# Patient Record
Sex: Male | Born: 1985 | Hispanic: No | Marital: Married | Smoking: Current every day smoker
Health system: Southern US, Community
[De-identification: ages and names within clinical notes are randomized; demographics above are authoritative.]

## PROBLEM LIST (undated history)

## (undated) DIAGNOSIS — K760 Fatty (change of) liver, not elsewhere classified: Secondary | ICD-10-CM

---

## 2016-10-22 ENCOUNTER — Ambulatory Visit
Admission: RE | Admit: 2016-10-22 | Discharge: 2016-10-22 | Disposition: A | Discharge status: Home / Self Care | Payer: Self-pay | Source: Ambulatory Visit | Attending: Internal Medicine | Admitting: Internal Medicine

## 2016-10-22 ENCOUNTER — Other Ambulatory Visit: Payer: Self-pay | Admitting: Internal Medicine

## 2016-10-22 DIAGNOSIS — M25362 Other instability, left knee: Secondary | ICD-10-CM

## 2016-10-22 DIAGNOSIS — R52 Pain, unspecified: Secondary | ICD-10-CM

## 2016-10-22 DIAGNOSIS — M25361 Other instability, right knee: Secondary | ICD-10-CM

## 2016-10-22 LAB — LIPID PANEL
Cholesterol: 239 — AB (ref 0–200)
HDL: 30 — AB (ref 35–70)
LDL Cholesterol: 182

## 2017-08-16 DIAGNOSIS — H5710 Ocular pain, unspecified eye: Secondary | ICD-10-CM | POA: Insufficient documentation

## 2017-09-26 IMAGING — CR DG KNEE COMPLETE 4+V*L*
4 series · 4 of 4 positions shown · non-contrast
Comparison: No recent prior.

CLINICAL DATA: Instability.  No injury.

EXAM:
LEFT KNEE - COMPLETE 4+ VIEW

[w knee ap left]
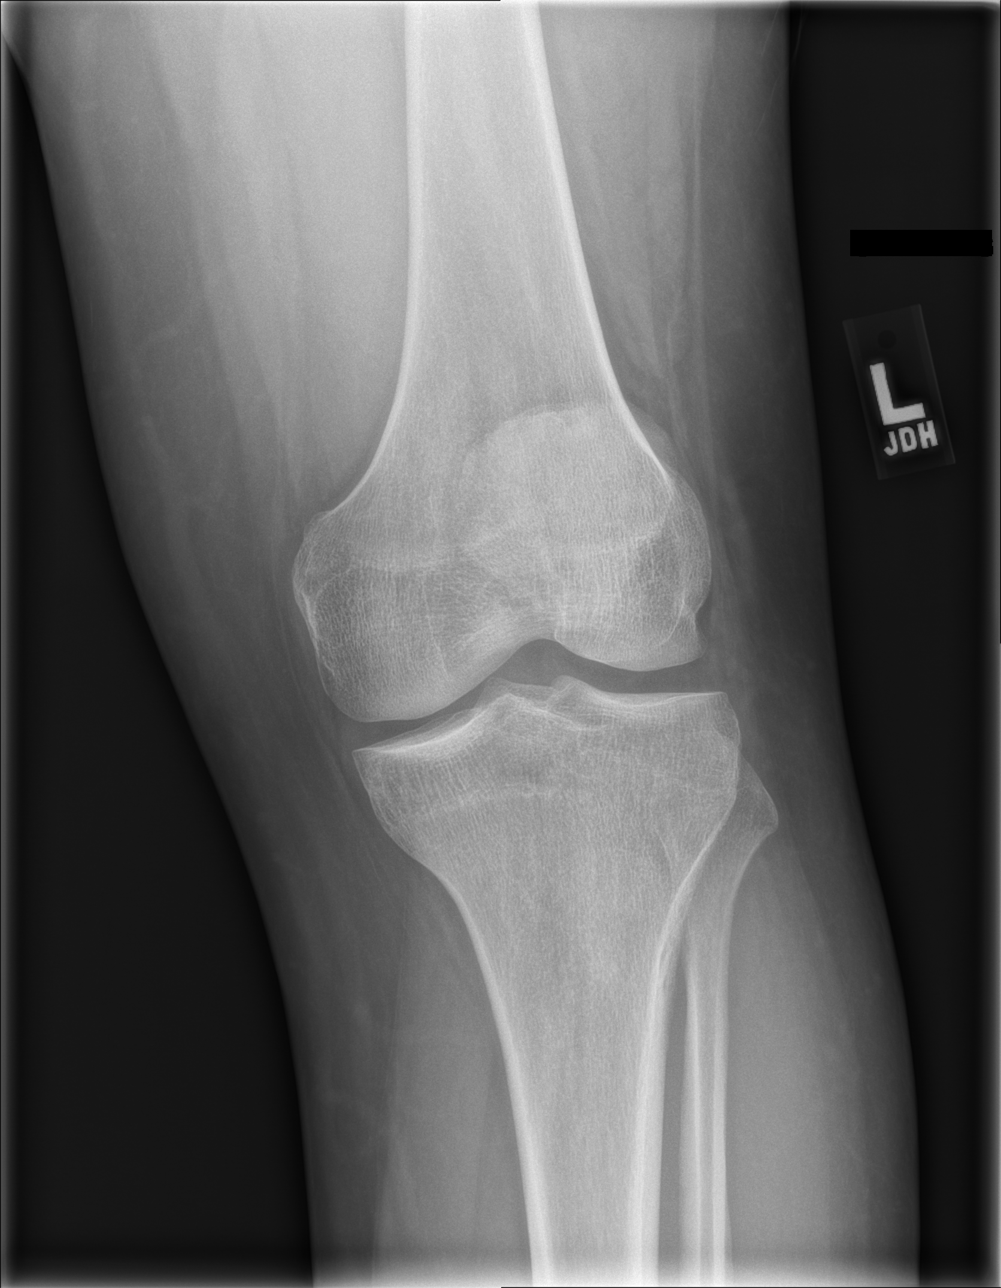

[w knee lat left]
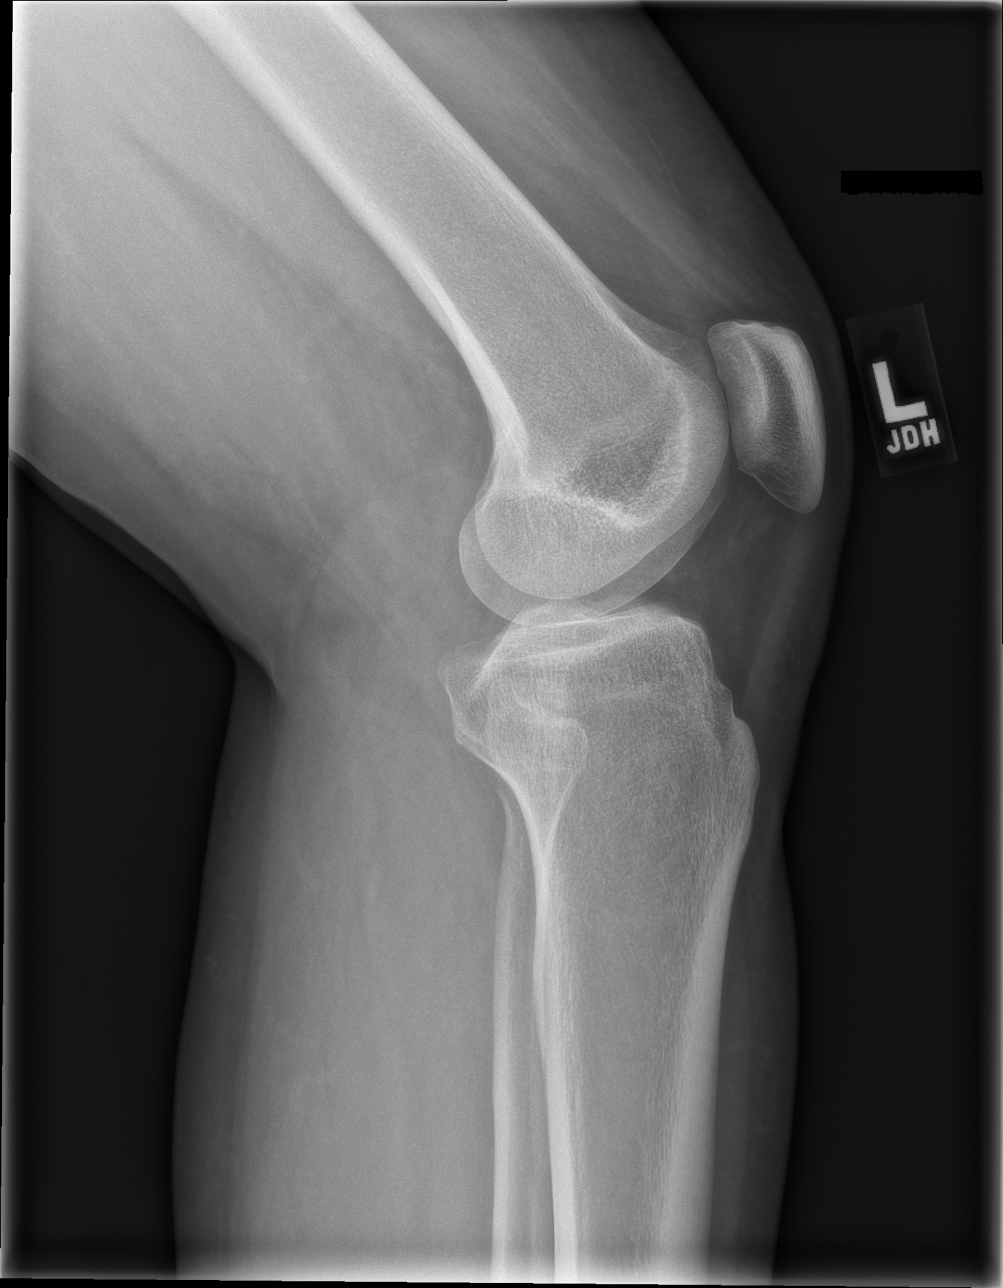

[x knee tunnel left]
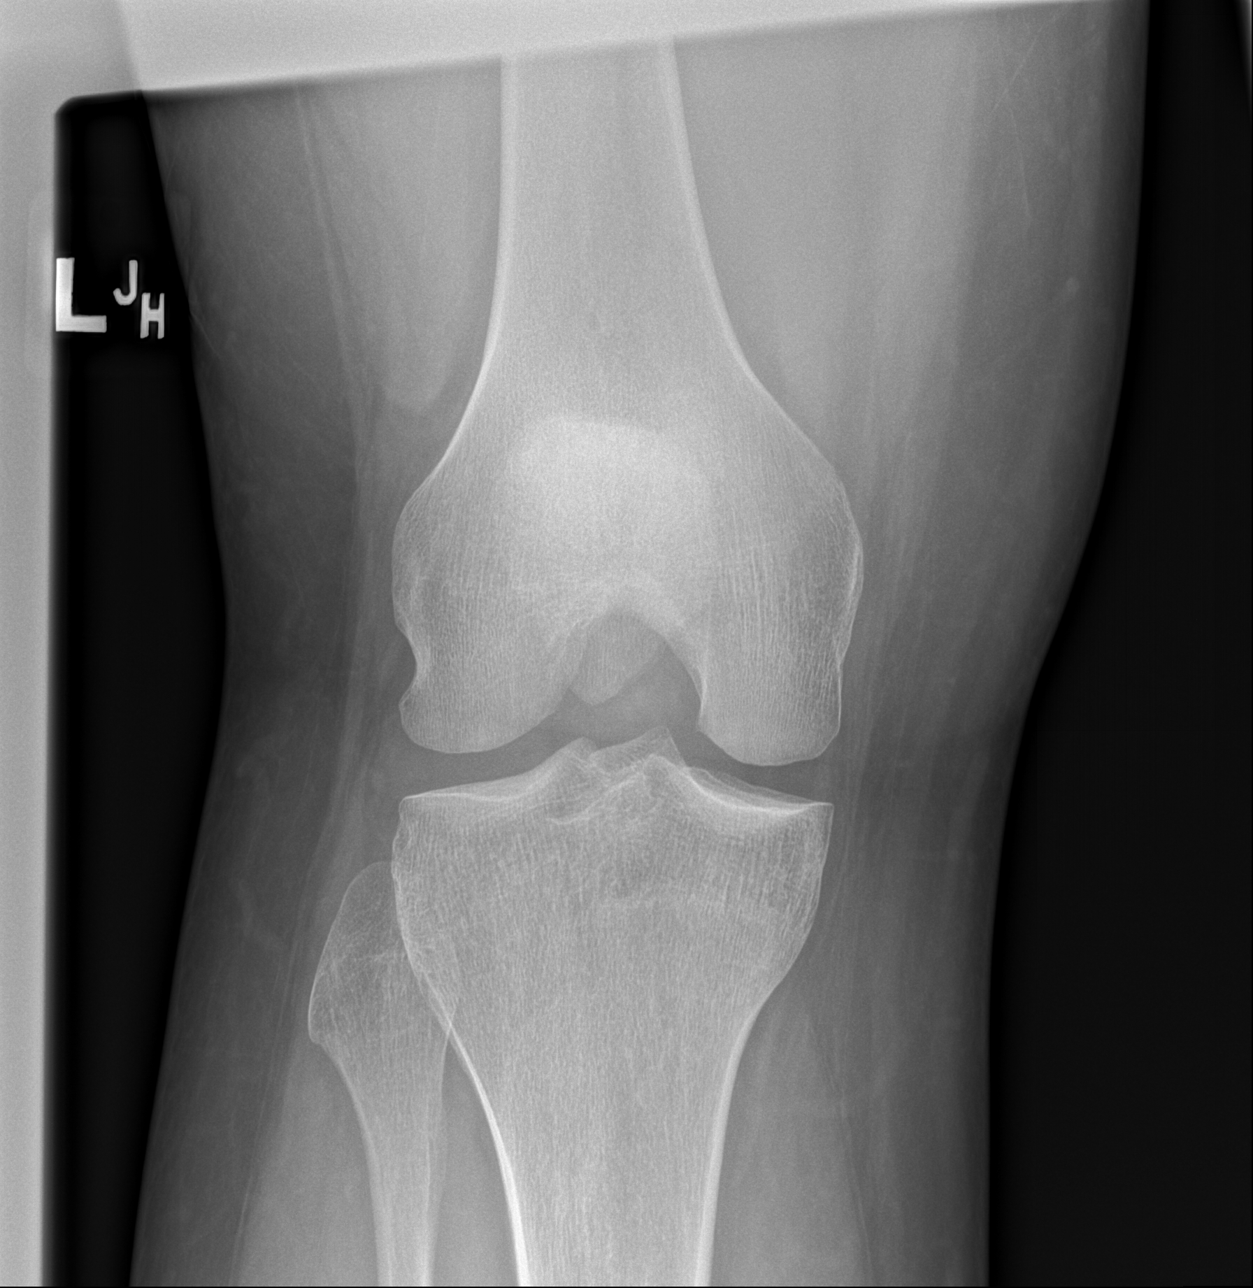

[x knee sunrise left]
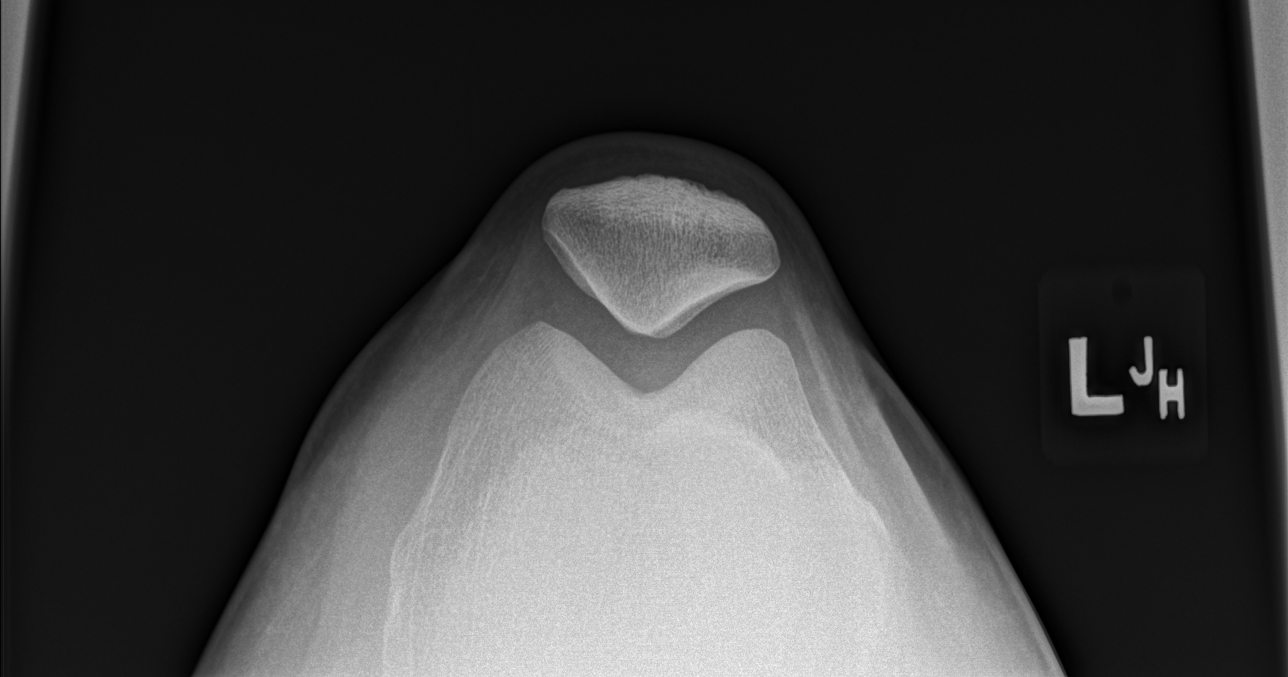

[4 of 4 positions shown; findings below may reference images not displayed]

FINDINGS: No acute bony or joint abnormality identified. No evidence of
fracture or dislocation.
IMPRESSION: No acute or focal abnormality.

## 2018-07-20 DIAGNOSIS — H5712 Ocular pain, left eye: Secondary | ICD-10-CM

## 2018-08-14 ENCOUNTER — Encounter: Payer: Self-pay | Admitting: Emergency Medicine

## 2018-08-14 ENCOUNTER — Other Ambulatory Visit: Payer: Self-pay

## 2018-08-14 ENCOUNTER — Emergency Department
Admission: EM | Admit: 2018-08-14 | Discharge: 2018-08-14 | Disposition: A | Discharge status: Home / Self Care | Payer: Self-pay | Attending: Emergency Medicine | Admitting: Emergency Medicine

## 2018-08-14 ENCOUNTER — Emergency Department: Payer: Self-pay

## 2018-08-14 DIAGNOSIS — R197 Diarrhea, unspecified: Secondary | ICD-10-CM | POA: Insufficient documentation

## 2018-08-14 DIAGNOSIS — Z79899 Other long term (current) drug therapy: Secondary | ICD-10-CM | POA: Insufficient documentation

## 2018-08-14 DIAGNOSIS — R1011 Right upper quadrant pain: Secondary | ICD-10-CM | POA: Insufficient documentation

## 2018-08-14 DIAGNOSIS — F17210 Nicotine dependence, cigarettes, uncomplicated: Secondary | ICD-10-CM | POA: Insufficient documentation

## 2018-08-14 DIAGNOSIS — R1013 Epigastric pain: Secondary | ICD-10-CM

## 2018-08-14 LAB — URINALYSIS, COMPLETE (UACMP) WITH MICROSCOPIC
BACTERIA UA: NONE SEEN
Bilirubin Urine: NEGATIVE
Glucose, UA: NEGATIVE mg/dL
Hgb urine dipstick: NEGATIVE
KETONES UR: NEGATIVE mg/dL
Leukocytes, UA: NEGATIVE
Nitrite: NEGATIVE
PROTEIN: NEGATIVE mg/dL
Specific Gravity, Urine: 1.013 (ref 1.005–1.030)
pH: 7 (ref 5.0–8.0)

## 2018-08-14 LAB — COMPREHENSIVE METABOLIC PANEL
ALBUMIN: 4.1 g/dL (ref 3.5–5.0)
ALK PHOS: 44 U/L (ref 38–126)
ALT: 24 U/L (ref 0–44)
ANION GAP: 7 (ref 5–15)
AST: 17 U/L (ref 15–41)
BUN: 6 mg/dL (ref 6–20)
CALCIUM: 9.1 mg/dL (ref 8.9–10.3)
CHLORIDE: 106 mmol/L (ref 98–111)
CO2: 27 mmol/L (ref 22–32)
Creatinine, Ser: 0.83 mg/dL (ref 0.61–1.24)
GFR calc Af Amer: >60 mL/min (ref >60)
GFR calc non Af Amer: >60 mL/min (ref >60)
GLUCOSE: 93 mg/dL (ref 70–99)
Potassium: 4.4 mmol/L (ref 3.5–5.1)
Sodium: 140 mmol/L (ref 135–145)
Total Bilirubin: 0.7 mg/dL (ref 0.3–1.2)
Total Protein: 7.3 g/dL (ref 6.5–8.1)

## 2018-08-14 LAB — CBC
HEMATOCRIT: 45.5 % (ref 40.0–52.0)
HEMOGLOBIN: 15.6 g/dL (ref 13.0–18.0)
MCH: 29.2 pg (ref 26.0–34.0)
MCHC: 34.3 g/dL (ref 32.0–36.0)
MCV: 85.3 fL (ref 80.0–100.0)
Platelets: 363 10*3/uL (ref 150–440)
RBC: 5.33 MIL/uL (ref 4.40–5.90)
RDW: 14.3 % (ref 11.5–14.5)
WBC: 9 10*3/uL (ref 3.8–10.6)

## 2018-08-14 LAB — LIPASE, BLOOD: LIPASE: 22 U/L (ref 11–51)

## 2018-08-14 MED ORDER — DICYCLOMINE HCL 10 MG PO CAPS
20.0000 mg | ORAL_CAPSULE | Freq: Once | ORAL | Status: AC
  Administered 2018-08-14: 20 mg via ORAL
  Filled 2018-08-14: qty 2

## 2018-08-14 MED ORDER — DICYCLOMINE HCL 20 MG PO TABS: 20.0000 mg | ORAL_TABLET | Freq: Three times a day (TID) | ORAL | 0 refills | Status: AC | PRN

## 2018-08-14 NOTE — ED Triage Notes (Signed)
Says mid abd pain.

## 2018-08-14 NOTE — ED Triage Notes (Signed)
C/O abdominal pain and nausea.  Also reports diarrhea yesterday.

## 2018-08-14 NOTE — ED Notes (Signed)
Patient transported to Ultrasound 

## 2018-08-14 NOTE — ED Provider Notes (Signed)
[facility] Emergency Department Provider Note  Time seen: 9:04 PM  I have reviewed the triage vital signs and the nursing notes.   HISTORY  Chief Complaint Abdominal Pain    HPI Marc Rivers is a 32 y.o. male with no significant past medical history presents to the emergency department with abdominal pain and diarrhea.  According to the patient for the past 2 days he has been experiencing some mid abdominal discomfort she describes as moderate type cramping along with diarrhea.  Denies any nausea vomiting or fever.  Denies any worsening with food.  Patient states he took Pepto-Bismol and has not had any diarrhea today but continues to have mid abdominal cramping so he came to the emergency department for evaluation.   History reviewed. No pertinent past medical history.  There are no active problems to display for this patient.   History reviewed. No pertinent surgical history.  Prior to Admission medications   Medication Sig Start Date End Date Taking? Authorizing Provider  atorvastatin (LIPITOR) 10 MG tablet Take 10 mg by mouth daily.    [provider]    No Known Allergies  No family history on file.  Social History Social History   Tobacco Use  . Smoking status: Current Every Day Smoker    Types: Cigarettes  . Smokeless tobacco: Never Used  Substance Use Topics  . Alcohol use: Never    Frequency: Never  . Drug use: Not on file    Review of Systems Constitutional: Negative for fever. Cardiovascular: Negative for chest pain. Respiratory: Negative for shortness of breath. Gastrointestinal: Mid abdominal cramping.  Negative for nausea vomiting.  Positive for diarrhea Genitourinary: Negative for urinary compaints Musculoskeletal: Negative for musculoskeletal complaints Skin: Negative for skin complaints  Neurological: Negative for headache All other ROS negative  ____________________________________________   PHYSICAL  EXAM:  VITAL SIGNS: ED Triage Vitals  Enc Vitals Group     BP 08/14/18 1510 (!) 128/92     Pulse Rate 08/14/18 1510 65     Resp 08/14/18 1510 16     Temp 08/14/18 1510 98.5 F (36.9 C)     Temp Source 08/14/18 1510 Oral     SpO2 08/14/18 1510 97 %     Weight 08/14/18 1508 220 lb (99.8 kg)     Height 08/14/18 1508 5\' 10"  (1.778 m)     Head Circumference --      Peak Flow --      Pain Score 08/14/18 2022 8     Pain Loc --      Pain Edu? --      Excl. in GC? --     Constitutional: Alert and oriented. Well appearing and in no distress. Eyes: Normal exam ENT   Head: Normocephalic and atraumatic.   Mouth/Throat: Mucous membranes are moist. Cardiovascular: Normal rate, regular rhythm. No murmur Respiratory: Normal respiratory effort without tachypnea nor retractions. Breath sounds are clear Gastrointestinal: Soft, moderate upper quadrant tenderness to palpation.  No rebound or guarding or distention. Musculoskeletal: Nontender with normal range of motion in all extremities.  Neurologic:  Normal speech and language. No gross focal neurologic deficits Skin:  Skin is warm, dry and intact.  Psychiatric: Mood and affect are normal.   ____________________________________________     RADIOLOGY  Ultrasound shows fatty liver, normal gall bladder.  ____________________________________________   INITIAL IMPRESSION / ASSESSMENT AND PLAN / ED COURSE  Pertinent labs & imaging results that were available during my care of the patient were  reviewed by me and considered in my medical decision making (see chart for details).  Patient presents to the emergency department for abdominal pain and diarrhea starting 2 days ago.  Diarrhea has since resolved after using Pepto-Bismol continues to have moderate cramping mid abdominal discomfort.  Denies any fever.  Patient does have mild right upper quadrant tenderness to palpation on examination.  Labs are reassuring however given his right  upper quadrant tenderness we will proceed with a right upper quadrant ultrasound to further evaluate.  Patient agreeable to this plan of care.  Patient's labs have resulted largely within normal limits including normal LFTs and lipase.  Patient's ultrasound shows fatty liver otherwise negative.  We will place the patient on Bentyl and discharged with PCP follow-up.  I discussed return precautions with the Arabic interpreter.  ____________________________________________   FINAL CLINICAL IMPRESSION(S) / ED DIAGNOSES  Right upper quadrant pain Diarrhea    Minna AntisPaduchowski, Siobhan Zaro, MD 08/14/18 2109

## 2020-11-22 ENCOUNTER — Other Ambulatory Visit: Payer: Self-pay

## 2020-11-22 ENCOUNTER — Encounter: Payer: Self-pay | Admitting: Emergency Medicine

## 2020-11-22 ENCOUNTER — Ambulatory Visit
Admission: EM | Admit: 2020-11-22 | Discharge: 2020-11-22 | Disposition: A | Discharge status: Home / Self Care | Payer: BLUE CROSS/BLUE SHIELD | Attending: Emergency Medicine | Admitting: Emergency Medicine

## 2020-11-22 DIAGNOSIS — H6692 Otitis media, unspecified, left ear: Secondary | ICD-10-CM

## 2020-11-22 HISTORY — DX: Fatty (change of) liver, not elsewhere classified: K76.0

## 2020-11-22 MED ORDER — AMOXICILLIN 875 MG PO TABS: 875.0000 mg | ORAL_TABLET | Freq: Two times a day (BID) | ORAL | 0 refills | Status: AC

## 2020-11-22 NOTE — Discharge Instructions (Signed)
Take the amoxicillin as directed.  Follow up with your primary care provider if your symptoms are not improving.   ° ° °

## 2020-11-22 NOTE — ED Provider Notes (Signed)
Marc Rivers    CSN: [id number] Arrival date & time: 11/22/20  1513      History   Chief Complaint Chief Complaint  Patient presents with  . Otalgia  . Headache    HPI Marc Rivers is a 34 y.o. male.   Patient presents with 1 day history of headache and left ear pain.  No treatments attempted at home.  He denies fever, chills, rash, sore throat, cough, shortness of breath, vomiting, diarrhea, or other symptoms.  He states he had cold symptoms 2 weeks ago but these have resolved.  The history is provided by the patient. A language interpreter was used.    Past Medical History:  Diagnosis Date  . Fatty liver     Patient Active Problem List   Diagnosis Date Noted  . Eye pain 08/16/2017    History reviewed. No pertinent surgical history.     Home Medications    Prior to Admission medications   Medication Sig Start Date End Date Taking? Authorizing Provider  amoxicillin (AMOXIL) 875 MG tablet Take 1 tablet (875 mg total) by mouth 2 (two) times daily for 7 days. 11/22/20 11/29/20  Mickie Bail, NP  atorvastatin (LIPITOR) 10 MG tablet Take 10 mg by mouth daily.    [provider]  dicyclomine (BENTYL) 20 MG tablet Take 1 tablet (20 mg total) by mouth 3 (three) times daily as needed for spasms. 08/14/18 08/14/19  Minna Antis, MD    Family History History reviewed. No pertinent family history.  Social History Social History   Tobacco Use  . Smoking status: Current Every Day Smoker    Types: Cigarettes  . Smokeless tobacco: Never Used  Substance Use Topics  . Alcohol use: Never  . Drug use: Not on file     Allergies   Patient has no known allergies.   Review of Systems Review of Systems  Constitutional: Negative for chills and fever.  HENT: Positive for ear pain. Negative for sore throat.   Eyes: Negative for pain and visual disturbance.  Respiratory: Negative for cough and shortness of breath.   Cardiovascular: Negative for  chest pain and palpitations.  Gastrointestinal: Negative for abdominal pain and vomiting.  Genitourinary: Negative for dysuria and hematuria.  Musculoskeletal: Negative for arthralgias and back pain.  Skin: Negative for color change and rash.  Neurological: Positive for headaches. Negative for seizures and syncope.  All other systems reviewed and are negative.    Physical Exam Triage Vital Signs ED Triage Vitals  Enc Vitals Group     BP      Pulse      Resp      Temp      Temp src      SpO2      Weight      Height      Head Circumference      Peak Flow      Pain Score      Pain Loc      Pain Edu?      Excl. in GC?    No data found.  Updated Vital Signs BP 109/72 (BP Location: Left Arm)   Pulse 86   Temp 98.4 F (36.9 C) (Oral)   Resp 16   SpO2 97%   Visual Acuity Right Eye Distance:   Left Eye Distance:   Bilateral Distance:    Right Eye Near:   Left Eye Near:    Bilateral Near:     Physical Exam Vitals  and nursing note reviewed.  Constitutional:      General: He is not in acute distress.    Appearance: He is well-developed.  HENT:     Head: Normocephalic and atraumatic.     Right Ear: Tympanic membrane normal.     Left Ear: Tympanic membrane is erythematous.     Nose: Nose normal.     Mouth/Throat:     Mouth: Mucous membranes are moist.     Pharynx: Oropharynx is clear.  Eyes:     Conjunctiva/sclera: Conjunctivae normal.  Cardiovascular:     Rate and Rhythm: Normal rate and regular rhythm.     Heart sounds: Normal heart sounds.  Pulmonary:     Effort: Pulmonary effort is normal. No respiratory distress.     Breath sounds: Normal breath sounds.  Abdominal:     Palpations: Abdomen is soft.     Tenderness: There is no abdominal tenderness. There is no guarding or rebound.  Musculoskeletal:     Cervical back: Neck supple.  Skin:    General: Skin is warm and dry.     Findings: No rash.  Neurological:     General: No focal deficit present.      Mental Status: He is alert and oriented to person, place, and time.     Gait: Gait normal.  Psychiatric:        Mood and Affect: Mood normal.        Behavior: Behavior normal.      UC Treatments / Results  Labs (all labs ordered are listed, but only abnormal results are displayed) Labs Reviewed - No data to display  EKG   Radiology No results found.  Procedures Procedures (including critical care time)  Medications Ordered in UC Medications - No data to display  Initial Impression / Assessment and Plan / UC Course  I have reviewed the triage vital signs and the nursing notes.  Pertinent labs & imaging results that were available during my care of the patient were reviewed by me and considered in my medical decision making (see chart for details).   Left otitis media.  Patient declines COVID test.  Treating with amoxicillin.  Discussed symptomatic treatment such as ibuprofen as needed for discomfort.  Instructed patient to follow-up with his PCP if his symptoms are not improving.  Patient agrees to plan of care.  Final Clinical Impressions(s) / UC Diagnoses   Final diagnoses:  Left otitis media, unspecified otitis media type     Discharge Instructions     Take the amoxicillin as directed.    Follow up with your primary care provider if your symptoms are not improving.       ED Prescriptions    Medication Sig Dispense Auth. Provider   amoxicillin (AMOXIL) 875 MG tablet Take 1 tablet (875 mg total) by mouth 2 (two) times daily for 7 days. 14 tablet Mickie Bail, NP     PDMP not reviewed this encounter.   Mickie Bail, NP 11/22/20 1655

## 2020-11-22 NOTE — ED Triage Notes (Signed)
Patient c/o LFT sided ear pain and headache since last night.   Patient denies fever.   Patient hasn't used any medications for symptoms.   History of sinus problems (allergic source).

## 2024-04-10 ENCOUNTER — Ambulatory Visit: Admitting: Family Medicine

## 2024-05-22 ENCOUNTER — Encounter: Payer: Self-pay | Admitting: Internal Medicine

## 2024-05-22 ENCOUNTER — Ambulatory Visit: Admitting: Internal Medicine

## 2024-05-22 VITALS — BP 114/79 | HR 78 | Temp 99.1°F | Ht 70.32 in | Wt 222.0 lb

## 2024-05-22 DIAGNOSIS — M545 Low back pain, unspecified: Secondary | ICD-10-CM

## 2024-05-22 MED ORDER — CYCLOBENZAPRINE HCL 5 MG PO TABS: 5.0000 mg | ORAL_TABLET | Freq: Three times a day (TID) | ORAL | 1 refills | Status: AC | PRN

## 2024-05-22 MED ORDER — IBUPROFEN 800 MG PO TABS: 800.0000 mg | ORAL_TABLET | Freq: Three times a day (TID) | ORAL | 0 refills | Status: AC | PRN

## 2024-05-22 MED ORDER — IBUPROFEN 600 MG PO TABS: 600.0000 mg | ORAL_TABLET | Freq: Three times a day (TID) | ORAL | 0 refills | Status: DC | PRN

## 2024-05-22 MED ORDER — CYCLOBENZAPRINE HCL 5 MG PO TABS: 5.0000 mg | ORAL_TABLET | Freq: Three times a day (TID) | ORAL | 1 refills | Status: DC | PRN

## 2024-05-22 NOTE — Progress Notes (Signed)
 Established patient visit      Patient: Marc Rivers   DOB: 11/06/1986   38 y.o. Male  MRN: [id number] Visit Date: 05/22/2024  Today'[facility] healthcare provider: Arzella Bitters, MD  Subjective:    Chief Complaint  Patient presents with   Follow-up   C/o occasional left lower back pain. Labs unavailable for review as he did them at [facility] clinic.        Medications: Outpatient Medications Prior to Visit  Medication Sig   rosuvastatin (CRESTOR) 10 MG tablet Take 10 mg by mouth daily. 1 Tablet by mouth at bedtime   atorvastatin (LIPITOR) 10 MG tablet Take 10 mg by mouth daily. (Patient not taking: Reported on 05/22/2024)   dicyclomine  (BENTYL ) 20 MG tablet Take 1 tablet (20 mg total) by mouth 3 (three) times daily as needed for spasms.   No facility-administered medications prior to visit.    Review of Systems  All other systems reviewed and are negative.       Objective:    BP 114/79 (BP Location: Left Arm, Patient Position: Sitting, Cuff Size: Normal)   Pulse 78   Temp 99.1 F (37.3 C) (Oral)   Ht 5' 10.32" (1.786 m)   Wt 222 lb (100.7 kg)   SpO2 97%   BMI 31.57 kg/m    Physical Exam Vitals reviewed.  Constitutional:      Appearance: Normal appearance. He is obese.  HENT:     Head: Normocephalic.     Left Ear: There is no impacted cerumen.     Nose: Nose normal.     Mouth/Throat:     Mouth: Mucous membranes are moist.     Pharynx: No posterior oropharyngeal erythema.  Eyes:     Extraocular Movements: Extraocular movements intact.     Pupils: Pupils are equal, round, and reactive to light.  Cardiovascular:     Rate and Rhythm: Regular rhythm.     Chest Wall: PMI is not displaced.     Pulses: Normal pulses.     Heart sounds: Normal heart sounds. No murmur heard. Pulmonary:     Effort: Pulmonary effort is normal.     Breath sounds: Normal air entry. No rhonchi or rales.  Abdominal:     General: Abdomen is flat. Bowel sounds are normal. There  is no distension.     Palpations: Abdomen is soft. There is no hepatomegaly, splenomegaly or mass.     Tenderness: There is no abdominal tenderness.  Musculoskeletal:        General: Normal range of motion.     Cervical back: Normal range of motion and neck supple.     Lumbar back: Spasms and tenderness present.     Right lower leg: No edema.     Left lower leg: No edema.  Skin:    General: Skin is warm and dry.  Neurological:     General: No focal deficit present.     Mental Status: He is alert and oriented to person, place, and time.     Cranial Nerves: No cranial nerve deficit.     Motor: No weakness.  Psychiatric:        Mood and Affect: Mood normal.        Behavior: Behavior normal.      No results found for any visits on 05/22/24.    Assessment & Plan:       Problem List Items Addressed This Visit   None Visit Diagnoses  Lumbar pain    -  Primary   Relevant Medications   cyclobenzaprine (FLEXERIL) 5 MG tablet   ibuprofen (ADVIL) 800 MG tablet         Arzella Bitters, MD  [facility] [phone] (phone) [phone] (fax)  [facility]

## 2024-06-26 ENCOUNTER — Ambulatory Visit: Admitting: Gastroenterology

## 2024-06-26 ENCOUNTER — Ambulatory Visit: Admitting: Internal Medicine
